# Patient Record
Sex: Male | Born: 1950 | Race: Black or African American | Hispanic: Refuse to answer | Marital: Married | Smoking: Never smoker
Health system: Southern US, Community
[De-identification: ages and names within clinical notes are randomized; demographics above are authoritative.]

---

## 2013-10-22 ENCOUNTER — Ambulatory Visit: Payer: 59

## 2013-10-22 ENCOUNTER — Ambulatory Visit (INDEPENDENT_AMBULATORY_CARE_PROVIDER_SITE_OTHER): Payer: 59 | Admitting: Family Medicine

## 2013-10-22 VITALS — BP 100/70 | HR 93 | Temp 98.5°F | Resp 18 | Ht 72.0 in | Wt 203.0 lb

## 2013-10-22 DIAGNOSIS — R0982 Postnasal drip: Secondary | ICD-10-CM

## 2013-10-22 DIAGNOSIS — R05 Cough: Secondary | ICD-10-CM

## 2013-10-22 DIAGNOSIS — J029 Acute pharyngitis, unspecified: Secondary | ICD-10-CM

## 2013-10-22 DIAGNOSIS — R059 Cough, unspecified: Secondary | ICD-10-CM

## 2013-10-22 LAB — POCT INFLUENZA A/B
Influenza A, POC: NEGATIVE
Influenza B, POC: NEGATIVE

## 2013-10-22 LAB — POCT RAPID STREP A (OFFICE): Rapid Strep A Screen: NEGATIVE

## 2013-10-22 MED ORDER — FLUTICASONE PROPIONATE 50 MCG/ACT NA SUSP: 2.0000 | Freq: Every day | NASAL | Status: DC

## 2013-10-22 MED ORDER — HYDROCODONE-HOMATROPINE 5-1.5 MG/5ML PO SYRP
5.0000 mL | ORAL_SOLUTION | ORAL | Status: DC | PRN
Start: 2013-10-22 — End: 2014-04-15

## 2013-10-22 MED ORDER — BENZONATATE 100 MG PO CAPS: 100.0000 mg | ORAL_CAPSULE | Freq: Three times a day (TID) | ORAL | Status: DC | PRN

## 2013-10-22 NOTE — Progress Notes (Signed)
Subjective: 63 year old man who has a home in the [place] area as well as hearing [place]. He got sick on Friday with a respiratory tract infection. He had fevers up to 103. He spoke with his physician in [place] who gave him Tylenol alternating with ibuprofen. The fever came down. His chest continued to be congested with him coughing a lot at nighttime and daytime. His throat was very sore but it is let up a little bit now. He felt achy. He is not smoke. His only regular medicine is lorazepam for a facial twitch  Objective: Pleasant alert gentleman in no acute distress at this time. Currently his fever is down. His TMs are normal. There is a little more wax in the right than the left. Throat erythematous especially on the right side of the throat. Culture and strep tests were taken. Neck supple without significant nodes. Chest has coarse breath sounds posteriorly especially. Seems a little more the right base. Heart was regular without murmurs.  Assessment: Pharyngitis Cough Fevers  Plan: CBC, chest x-ray, strep screen  [facility] reading (PRIMARY) by  Dr. Alwyn RenHopper Normal chest  Results for orders placed in visit on 10/22/13  POCT RAPID STREP A (OFFICE)      Result Value Ref Range   Rapid Strep A Screen Negative  Negative  POCT INFLUENZA A/B      Result Value Ref Range   Influenza A, POC Negative     Influenza B, POC Negative       .

## 2013-10-22 NOTE — Patient Instructions (Signed)
Drink plenty of fluids and get enough rest  Continue ibuprofen and Tylenol for fever or discomfort  Use the cough pills,  benzonatate, in the daytime  Use the Hycodan cough syrup in the evening or nighttime when it does not bother you to be drowsy  Use the nose spray 2 sprays each nostril twice daily for 3 days, then once daily  Return if worse or not improving

## 2013-10-25 LAB — CULTURE, GROUP A STREP: Organism ID, Bacteria: NORMAL

## 2014-04-15 ENCOUNTER — Ambulatory Visit (INDEPENDENT_AMBULATORY_CARE_PROVIDER_SITE_OTHER): Payer: 59

## 2014-04-15 ENCOUNTER — Ambulatory Visit (INDEPENDENT_AMBULATORY_CARE_PROVIDER_SITE_OTHER): Payer: 59 | Admitting: Family Medicine

## 2014-04-15 VITALS — BP 110/68 | HR 94 | Temp 98.2°F | Resp 16 | Ht 72.0 in | Wt 207.0 lb

## 2014-04-15 DIAGNOSIS — M791 Myalgia, unspecified site: Secondary | ICD-10-CM

## 2014-04-15 DIAGNOSIS — S301XXA Contusion of abdominal wall, initial encounter: Secondary | ICD-10-CM

## 2014-04-15 DIAGNOSIS — M546 Pain in thoracic spine: Secondary | ICD-10-CM

## 2014-04-15 LAB — POCT URINALYSIS DIPSTICK
Bilirubin, UA: NEGATIVE
Glucose, UA: NEGATIVE
Ketones, UA: NEGATIVE
Leukocytes, UA: NEGATIVE
Nitrite, UA: NEGATIVE
Protein, UA: NEGATIVE
Spec Grav, UA: 1.02
Urobilinogen, UA: 0.2
pH, UA: 5.5

## 2014-04-15 LAB — POCT CBC
Granulocyte percent: 59.8 %G (ref 37–80)
HCT, POC: 44.6 % (ref 43.5–53.7)
Hemoglobin: 13.7 g/dL — AB (ref 14.1–18.1)
Lymph, poc: 2.2 (ref 0.6–3.4)
MCH, POC: 26.4 pg — AB (ref 27–31.2)
MCHC: 30.7 g/dL — AB (ref 31.8–35.4)
MCV: 86.1 fL (ref 80–97)
MID (cbc): 0.5 (ref 0–0.9)
MPV: 6.4 fL (ref 0–99.8)
POC Granulocyte: 3.9 (ref 2–6.9)
POC LYMPH PERCENT: 33.2 %L (ref 10–50)
POC MID %: 7 %M (ref 0–12)
Platelet Count, POC: 322 10*3/uL (ref 142–424)
RBC: 5.18 M/uL (ref 4.69–6.13)
RDW, POC: 14.9 %
WBC: 6.6 10*3/uL (ref 4.6–10.2)

## 2014-04-15 LAB — POCT UA - MICROSCOPIC ONLY
Bacteria, U Microscopic: NEGATIVE
Crystals, Ur, HPF, POC: NEGATIVE
Yeast, UA: NEGATIVE

## 2014-04-15 MED ORDER — HYDROCODONE-ACETAMINOPHEN 5-325 MG PO TABS: 1.0000 | ORAL_TABLET | Freq: Four times a day (QID) | ORAL | Status: AC | PRN

## 2014-04-15 MED ORDER — CYCLOBENZAPRINE HCL 5 MG PO TABS: 5.0000 mg | ORAL_TABLET | Freq: Three times a day (TID) | ORAL | Status: AC | PRN
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
## 2014-04-15 NOTE — Patient Instructions (Signed)
Please return to urgent care if you feel any symptoms such as trouble breathing, change of color in urine, blood in your stool, or if your pain increases.
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
## 2015-11-17 ENCOUNTER — Encounter (HOSPITAL_COMMUNITY): Payer: Self-pay

## 2015-11-17 ENCOUNTER — Emergency Department (HOSPITAL_COMMUNITY)
Admission: EM | Admit: 2015-11-17 | Discharge: 2015-11-18 | Disposition: A | Discharge status: Home / Self Care | Payer: Managed Care, Other (non HMO) | Attending: Emergency Medicine | Admitting: Emergency Medicine

## 2015-11-17 DIAGNOSIS — R070 Pain in throat: Secondary | ICD-10-CM | POA: Diagnosis not present

## 2015-11-17 DIAGNOSIS — R0989 Other specified symptoms and signs involving the circulatory and respiratory systems: Secondary | ICD-10-CM | POA: Insufficient documentation

## 2015-11-17 NOTE — ED Notes (Signed)
Pt given sip of water and pt was able to swallow it without difficulty but reported it was just painful to swallow.

## 2015-11-17 NOTE — ED Notes (Signed)
Pt stated that he needed to take his family home because a family member had to "go to bed". Pt left without being seen by a Physician.

## 2015-11-17 NOTE — ED Notes (Signed)
Pt states he was eating fried chicken for dinner and thinks a some skin from the chicken may be lodged in his throat and is painful to swallow. Airway intact, speaking clear complete sentences.

## 2015-11-17 NOTE — ED Provider Notes (Signed)
I did not see this patient. Bedded electronically on trackboard but was found to have left the department.    Sofie RowerMike Seylah Wernert, MD 11/17/15 [phone]

## 2019-07-29 ENCOUNTER — Other Ambulatory Visit: Payer: Managed Care, Other (non HMO)

## 2019-08-06 ENCOUNTER — Ambulatory Visit: Payer: Managed Care, Other (non HMO) | Attending: Internal Medicine

## 2019-08-06 DIAGNOSIS — Z20822 Contact with and (suspected) exposure to covid-19: Secondary | ICD-10-CM

## 2019-08-07 ENCOUNTER — Telehealth: Payer: Self-pay

## 2019-08-07 LAB — NOVEL CORONAVIRUS, NAA: SARS-CoV-2, NAA: NOT DETECTED
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
## 2019-10-03 ENCOUNTER — Ambulatory Visit: Payer: Managed Care, Other (non HMO) | Attending: Internal Medicine

## 2019-10-03 DIAGNOSIS — Z23 Encounter for immunization: Secondary | ICD-10-CM

## 2019-10-03 NOTE — Progress Notes (Signed)
   Covid-19 Vaccination Clinic  Name:  Shawn Bishop    MRN: [id number] DOB: 1950-12-24  10/03/2019  Shawn Bishop was observed post Covid-19 immunization for 15 minutes without incident. He was provided with Vaccine Information Sheet and instruction to access the V-Safe system.   Shawn Bishop was instructed to call 911 with any severe reactions post vaccine: Marland Kitchen Difficulty breathing  . Swelling of face and throat  . A fast heartbeat  . A bad rash all over body  . Dizziness and weakness   Immunizations Administered    Name Date Dose VIS Date Route   Moderna COVID-19 Vaccine 10/03/2019 10:53 AM 0.5 mL 06/11/2019 Intramuscular   Manufacturer: Moderna   Lot: [id number]   NDC: 35521-747-15

## 2019-10-31 ENCOUNTER — Ambulatory Visit: Payer: Managed Care, Other (non HMO) | Attending: Internal Medicine

## 2019-10-31 DIAGNOSIS — Z23 Encounter for immunization: Secondary | ICD-10-CM

## 2019-10-31 NOTE — Progress Notes (Signed)
   Covid-19 Vaccination Clinic  Name:  Shawn Bishop    MRN: [id number] DOB: [birth year]  10/31/2019  Mr. Kestler was observed post Covid-19 immunization for 15 minutes without incident. He was provided with Vaccine Information Sheet and instruction to access the V-Safe system.   Mr. Callanan was instructed to call 911 with any severe reactions post vaccine: Marland Kitchen Difficulty breathing  . Swelling of face and throat  . A fast heartbeat  . A bad rash all over body  . Dizziness and weakness   Immunizations Administered    Name Date Dose VIS Date Route   Moderna COVID-19 Vaccine 10/31/2019  9:45 AM 0.5 mL 06/2019 Intramuscular   Manufacturer: Moderna   Lot: [id number]   NDC: 65537-482-70

## 2019-11-05 ENCOUNTER — Ambulatory Visit: Payer: Managed Care, Other (non HMO)

## 2020-07-20 ENCOUNTER — Ambulatory Visit: Payer: Managed Care, Other (non HMO)

## 2020-07-21 ENCOUNTER — Ambulatory Visit: Payer: Managed Care, Other (non HMO)

## 2020-07-28 ENCOUNTER — Ambulatory Visit: Payer: Managed Care, Other (non HMO)

## 2020-08-04 ENCOUNTER — Ambulatory Visit: Payer: Managed Care, Other (non HMO)
